# Patient Record
Sex: Male | Born: 1970 | Race: Black or African American | Hispanic: No | Marital: Single
Health system: Southern US, Community
[De-identification: ages and names within clinical notes are randomized; demographics above are authoritative.]

---

## 2014-04-23 ENCOUNTER — Encounter (HOSPITAL_COMMUNITY): Payer: Self-pay | Admitting: Emergency Medicine

## 2014-04-23 ENCOUNTER — Emergency Department (HOSPITAL_COMMUNITY): Payer: BC Managed Care – PPO

## 2014-04-23 ENCOUNTER — Emergency Department (HOSPITAL_COMMUNITY)
Admission: EM | Admit: 2014-04-23 | Discharge: 2014-04-23 | Disposition: A | Discharge status: Home / Self Care | Payer: BC Managed Care – PPO | Attending: Emergency Medicine | Admitting: Emergency Medicine

## 2014-04-23 DIAGNOSIS — N201 Calculus of ureter: Secondary | ICD-10-CM

## 2014-04-23 DIAGNOSIS — R319 Hematuria, unspecified: Secondary | ICD-10-CM

## 2014-04-23 LAB — URINE MICROSCOPIC-ADD ON

## 2014-04-23 LAB — URINALYSIS, ROUTINE W REFLEX MICROSCOPIC
Bilirubin Urine: NEGATIVE
GLUCOSE, UA: NEGATIVE mg/dL
Ketones, ur: NEGATIVE mg/dL
Leukocytes, UA: NEGATIVE
Nitrite: NEGATIVE
PROTEIN: NEGATIVE mg/dL
Specific Gravity, Urine: 1.012 (ref 1.005–1.030)
Urobilinogen, UA: 0.2 mg/dL (ref 0.0–1.0)
pH: 7 (ref 5.0–8.0)

## 2014-04-23 LAB — COMPREHENSIVE METABOLIC PANEL
ALT: 22 U/L (ref 0–53)
AST: 21 U/L (ref 0–37)
Albumin: 4 g/dL (ref 3.5–5.2)
Alkaline Phosphatase: 61 U/L (ref 39–117)
BILIRUBIN TOTAL: 0.5 mg/dL (ref 0.3–1.2)
BUN: 8 mg/dL (ref 6–23)
CHLORIDE: 101 meq/L (ref 96–112)
CO2: 24 mEq/L (ref 19–32)
Calcium: 9 mg/dL (ref 8.4–10.5)
Creatinine, Ser: 1.09 mg/dL (ref 0.50–1.35)
GFR calc Af Amer: >90 mL/min (ref >90)
GFR calc non Af Amer: 81 mL/min — ABNORMAL LOW (ref >90)
Glucose, Bld: 122 mg/dL — ABNORMAL HIGH (ref 70–99)
Potassium: 3.6 mEq/L — ABNORMAL LOW (ref 3.7–5.3)
SODIUM: 140 meq/L (ref 137–147)
Total Protein: 7.2 g/dL (ref 6.0–8.3)

## 2014-04-23 LAB — CBC WITH DIFFERENTIAL/PLATELET
Basophils Absolute: 0 10*3/uL (ref 0.0–0.1)
Basophils Relative: 0 % (ref 0–1)
Eosinophils Absolute: 0 10*3/uL (ref 0.0–0.7)
Eosinophils Relative: 0 % (ref 0–5)
HCT: 39.9 % (ref 39.0–52.0)
Hemoglobin: 13.4 g/dL (ref 13.0–17.0)
Lymphocytes Relative: 14 % (ref 12–46)
Lymphs Abs: 1 10*3/uL (ref 0.7–4.0)
MCH: 29.1 pg (ref 26.0–34.0)
MCHC: 33.6 g/dL (ref 30.0–36.0)
MCV: 86.6 fL (ref 78.0–100.0)
Monocytes Absolute: 0.3 10*3/uL (ref 0.1–1.0)
Monocytes Relative: 3 % (ref 3–12)
Neutro Abs: 6.2 10*3/uL (ref 1.7–7.7)
Neutrophils Relative %: 83 % — ABNORMAL HIGH (ref 43–77)
Platelets: 205 10*3/uL (ref 150–400)
RBC: 4.61 MIL/uL (ref 4.22–5.81)
RDW: 12.4 % (ref 11.5–15.5)
WBC: 7.5 10*3/uL (ref 4.0–10.5)

## 2014-04-23 MED ORDER — HYDROMORPHONE HCL PF 1 MG/ML IJ SOLN
1.0000 mg | Freq: Once | INTRAMUSCULAR | Status: AC
  Administered 2014-04-23: 1 mg via INTRAVENOUS
  Filled 2014-04-23: qty 1

## 2014-04-23 MED ORDER — OXYCODONE-ACETAMINOPHEN 5-325 MG PO TABS: 1.0000 | ORAL_TABLET | ORAL | Status: AC | PRN

## 2014-04-23 MED ORDER — ONDANSETRON HCL 4 MG/2ML IJ SOLN
4.0000 mg | Freq: Once | INTRAMUSCULAR | Status: AC
  Administered 2014-04-23: 4 mg via INTRAVENOUS
  Filled 2014-04-23: qty 2

## 2014-04-23 MED ORDER — IOHEXOL 300 MG/ML  SOLN: 50.0000 mL | Freq: Once | INTRAMUSCULAR | Status: DC | PRN

## 2014-04-23 MED ORDER — SODIUM CHLORIDE 0.9 % IV BOLUS (SEPSIS)
1000.0000 mL | Freq: Once | INTRAVENOUS | Status: AC
  Administered 2014-04-23: 1000 mL via INTRAVENOUS

## 2014-04-23 MED ORDER — MORPHINE SULFATE 4 MG/ML IJ SOLN
4.0000 mg | Freq: Once | INTRAMUSCULAR | Status: AC
  Administered 2014-04-23: 4 mg via INTRAVENOUS
  Filled 2014-04-23: qty 1

## 2014-04-23 MED ORDER — METOCLOPRAMIDE HCL 5 MG/ML IJ SOLN
10.0000 mg | Freq: Once | INTRAMUSCULAR | Status: AC
  Administered 2014-04-23: 10 mg via INTRAVENOUS
  Filled 2014-04-23: qty 2

## 2014-04-23 MED ORDER — TAMSULOSIN HCL 0.4 MG PO CAPS: 0.4000 mg | ORAL_CAPSULE | Freq: Every day | ORAL | Status: AC

## 2014-04-23 MED ORDER — IOHEXOL 300 MG/ML  SOLN
80.0000 mL | Freq: Once | INTRAMUSCULAR | Status: AC | PRN
  Administered 2014-04-23: 80 mL via INTRAVENOUS

## 2014-04-23 NOTE — Progress Notes (Signed)
  CARE MANAGEMENT ED NOTE 04/23/2014  Patient:  ANDREWJOSEPH, FORRISTER   Account Number:  [id number]  Date Initiated:  04/23/2014  Documentation initiated by:  Radford Pax  Subjective/Objective Assessment:   patient presents to Ed with right lower quadrant pain with nausea and vomiting.     Subjective/Objective Assessment Detail:     Action/Plan:   Action/Plan Detail:   Anticipated DC Date:       Status Recommendation to Physician:   Result of Recommendation:    Other ED Services  Consult Working Plan    DC Planning Services  Other  PCP issues    Choice offered to / List presented to:            Status of service:  Completed, signed off  ED Comments:   ED Comments Detail:  Cristy Friedlander  Signed  Progress Notes Service date: 04/23/2014 4:30 PM [facility] provided pt with a list of primary care resources to help patient establish primary care.

## 2014-04-23 NOTE — ED Provider Notes (Signed)
CSN: [id number]     Arrival date & time 04/23/14  1521 History   First MD Initiated Contact with Patient 04/23/14 1527     Chief Complaint  Patient presents with  . Abdominal Pain  . Emesis     (Consider location/radiation/quality/duration/timing/severity/associated sxs/prior Treatment) Patient is a 43 y.o. male presenting with abdominal pain and vomiting. The history is provided by the patient and medical records.  Abdominal Pain Associated symptoms: nausea and vomiting   Emesis Associated symptoms: abdominal pain    This is a 43 y.o. M with no significant PMH presenting to the ED for abdominal pain.  Pt states pain started earlier this morning and has progressively worsened throughout the day.  Pain localized to RLQ, described as sharp and stabbing and making it difficulty to find a comfortable position.  Endorses associated nausea and non-bloody, non-bilious emesis x4 today.  Has been unable to tolerate anything PO today.  Last BM this morning which was normal.  No urinary sx, flank pain, fever, or chills.  No prior hx of kidney stones.  No prior abdominal surgeries.    History reviewed. No pertinent past medical history. History reviewed. No pertinent past surgical history. No family history on file. History  Substance Use Topics  . Smoking status: Not on file  . Smokeless tobacco: Not on file  . Alcohol Use: Not on file    Review of Systems  Gastrointestinal: Positive for nausea, vomiting and abdominal pain.  All other systems reviewed and are negative.     Allergies  Review of patient's allergies indicates not on file.  Home Medications   Prior to Admission medications   Medication Sig Start Date End Date Taking? Authorizing Provider  Aspirin-Acetaminophen (GOODY BODY PAIN) 500-325 MG PACK Take 1 packet by mouth as needed (pain).   Yes Historical Provider, MD   BP 111/52  Pulse 85  Temp(Src) 98.8 F (37.1 C) (Oral)  Resp 16  SpO2 98%  Physical Exam  Nursing  note and vitals reviewed. Constitutional: He is oriented to person, place, and time. He appears well-developed and well-nourished.  Appears uncomfortable, writhing in bed  HENT:  Head: Normocephalic and atraumatic.  Mouth/Throat: Oropharynx is clear and moist.  Eyes: Conjunctivae and EOM are normal. Pupils are equal, round, and reactive to light.  Neck: Normal range of motion.  Cardiovascular: Normal rate, regular rhythm and normal heart sounds.   Pulmonary/Chest: Effort normal and breath sounds normal. No respiratory distress. He has no wheezes.  Abdominal: Soft. Bowel sounds are normal. There is tenderness in the right lower quadrant. There is tenderness at McBurney's point. There is no CVA tenderness and negative Murphy's sign.  Abdomen soft, non-distended, TTP in RLQ with + McBurney's point tenderness No CVA tenderness  Musculoskeletal: Normal range of motion.  Neurological: He is alert and oriented to person, place, and time.  Skin: Skin is warm and dry.  Psychiatric: He has a normal mood and affect.    ED Course  Procedures (including critical care time) Labs Review Labs Reviewed  CBC WITH DIFFERENTIAL - Abnormal; Notable for the following:    Neutrophils Relative % 83 (*)    All other components within normal limits  COMPREHENSIVE METABOLIC PANEL - Abnormal; Notable for the following:    Potassium 3.6 (*)    Glucose, Bld 122 (*)    GFR calc non Af Amer 81 (*)    All other components within normal limits  URINALYSIS, ROUTINE W REFLEX MICROSCOPIC - Abnormal; Notable for the  following:    Hgb urine dipstick LARGE (*)    All other components within normal limits  URINE MICROSCOPIC-ADD ON    Imaging Review Ct Abdomen Pelvis W Contrast  04/23/2014   CLINICAL DATA:  Right lower quadrant pain.  Nausea and vomiting.  EXAM: CT ABDOMEN AND PELVIS WITH CONTRAST  TECHNIQUE: Multidetector CT imaging of the abdomen and pelvis was performed using the standard protocol following bolus  administration of intravenous contrast.  CONTRAST:  27mL OMNIPAQUE IOHEXOL 300 MG/ML  SOLN  COMPARISON:  None.  FINDINGS: A 1-2 mm stone lies at the right ureteral vesicular junction causing mild right hydroureteronephrosis. There is mild stranding along the distal right ureter.  Normal appendix.  Clear lung bases. The heart is normal size. Sub cm low-density lesions in the right lobe of the liver, likely cysts. The liver is otherwise unremarkable.  Normal spleen, gallbladder and pancreas. No bile duct dilation. No adrenal masses. No renal masses. No left hydronephrosis. Left ureter is unremarkable.  No adenopathy. No abnormal fluid collections. The bowel is unremarkable.  No significant bony abnormality.  IMPRESSION: 1. 1-2 mm stone at the right ureterovesicular junction causes mild right hydroureteronephrosis. This is presumed to be the cause of this patient's right lower quadrant pain. No other evidence of an acute abnormality. 2. Normal appendix.   Electronically Signed   By: Amie Portland M.D.   On: 04/23/2014 18:19     EKG Interpretation None      MDM   Final diagnoses:  Right ureteral calculus  Hematuria   43-year-old male presenting to the ED for right lower quadrant abdominal pain, onset early this morning. He endorses associated nausea and vomiting. Patient denies urinary symptoms or flank pain. No parenchymal surgeries. On exam, patient is afebrile but he does appear very uncomfortable as writhing around in bed. We'll plan for labs and CT abdomen pelvis with contrast for further evaluation.  Labs reassuring. UA with large blood. CT revealing 2 mm stone at right UVJ with associated hydroureteronephrosis, appendix normal.  Patient's pain is well controlled after several doses of pain medication. He'll be discharged with urine strainer, Percocet, Flomax. He will followup with urology.  Discussed plan with patient, he/she acknowledged understanding and agreed with plan of care.  Return  precautions given for new or worsening symptoms.    Garlon Hatchet, PA-C 04/23/14 2100

## 2014-04-23 NOTE — ED Notes (Signed)
PA Sanders at bedside  

## 2014-04-23 NOTE — ED Notes (Signed)
Bed: WA07 Expected date:  Expected time:  Means of arrival:  Comments: EMS 

## 2014-04-23 NOTE — ED Notes (Signed)
Pt BIB EMS. Pt c/o RLQ pain since this morning with nausea and vomiting. Pt denies diarrhea. Pt was given Zofran 4mg  IVP by EMS PTA. Pt alert, no acute distress. Skin warm and dry.

## 2014-04-23 NOTE — ED Notes (Signed)
Patient transported to CT 

## 2014-04-23 NOTE — Discharge Instructions (Signed)
Take the prescribed medication as directed. Strain your urine to monitor for passage of stones. Follow-up with [facility]. Return to the ED for new or worsening symptoms.

## 2014-04-23 NOTE — Progress Notes (Signed)
[facility] provided pt with a list of primary care resources to help patient establish primary care.  °

## 2014-04-24 NOTE — ED Provider Notes (Signed)
Medical screening examination/treatment/procedure(s) were performed by non-physician practitioner and as supervising physician I was immediately available for consultation/collaboration.   EKG Interpretation None        Yukiko Minnich M Tait Balistreri, MD 04/24/14 0016 

## 2015-03-03 IMAGING — CT CT ABD-PELV W/ CM
1 of 3 series · 14 of 32 positions shown, 19 images · IV contrast (OMNIPAQUE 300)
Comparison: None.

CLINICAL DATA: Right lower quadrant pain.  Nausea and vomiting.

EXAM:
CT ABDOMEN AND PELVIS WITH CONTRAST
TECHNIQUE: Multidetector CT imaging of the abdomen and pelvis was performed
using the standard protocol following bolus administration of
intravenous contrast.
CONTRAST:  80mL OMNIPAQUE IOHEXOL 300 MG/ML  SOLN

[Series 2: abd/pel with · axial · 0.74mm/px · z∈[-472,-47]mm · 14 of 96 slices shown, 19 images]
[im 6/96  soft-tissue]
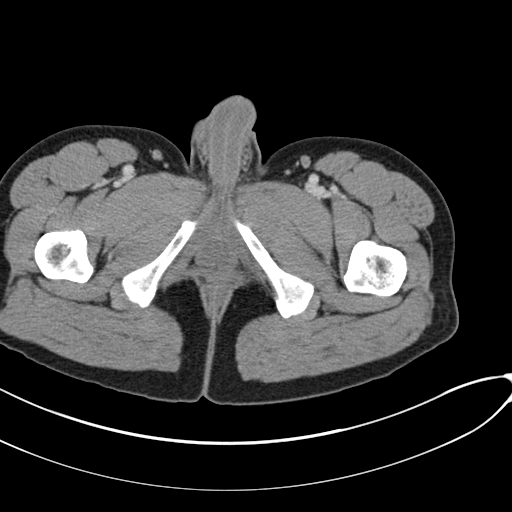
[im 6/96  bone]
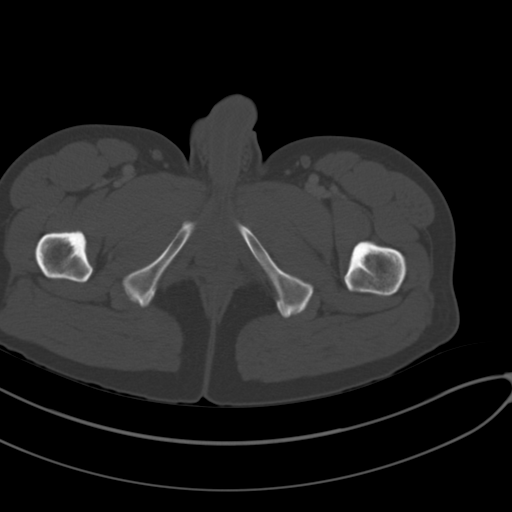
[im 16/96  soft-tissue]
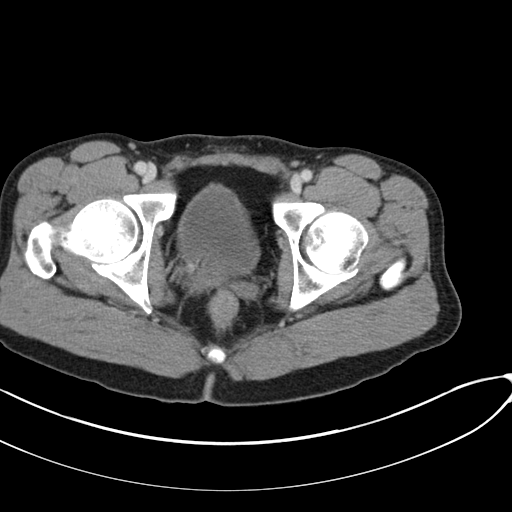
[im 21/96  soft-tissue]
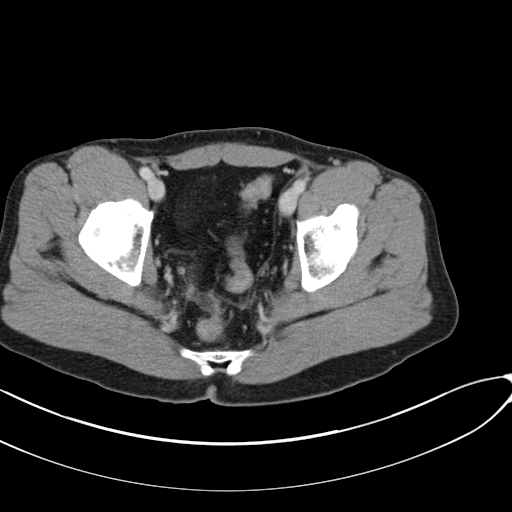
[im 26/96  soft-tissue]
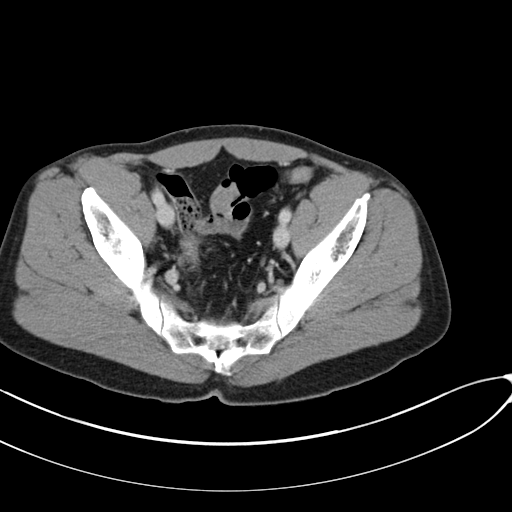
[im 36/96  soft-tissue]
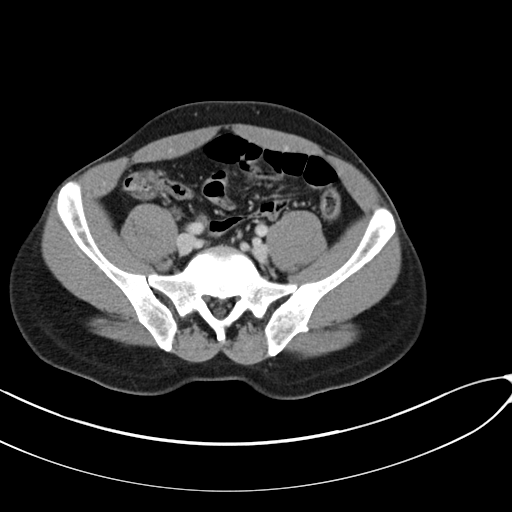
[im 41/96  soft-tissue]
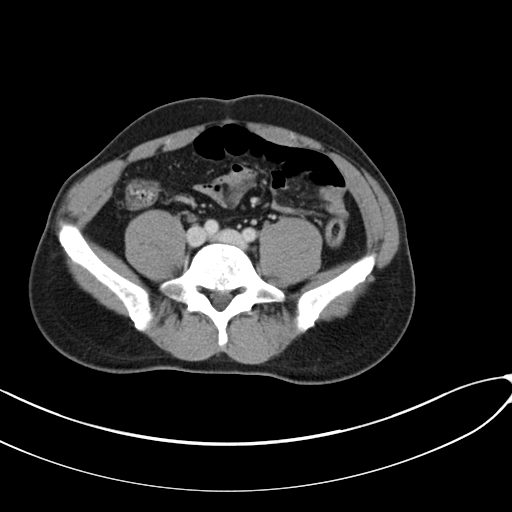
[im 51/96  soft-tissue]
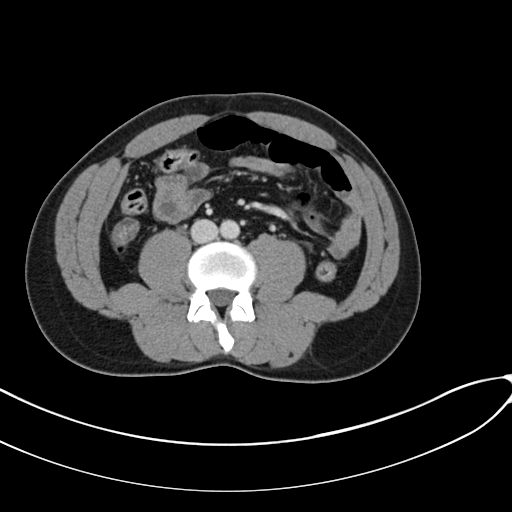
[im 56/96  soft-tissue]
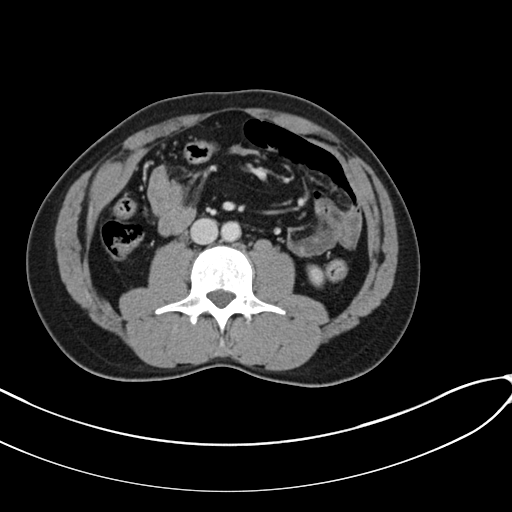
[im 61/96  soft-tissue]
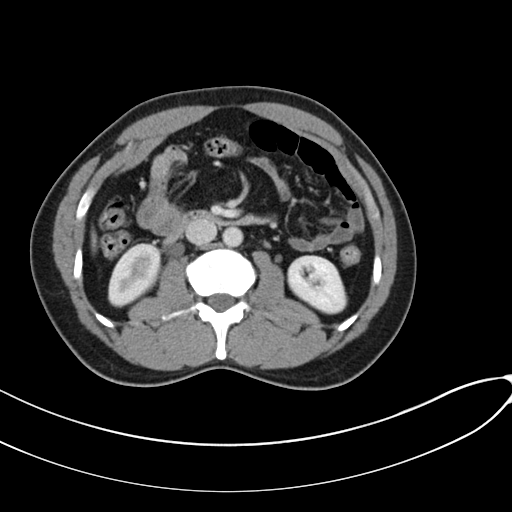
[im 61/96  bone]
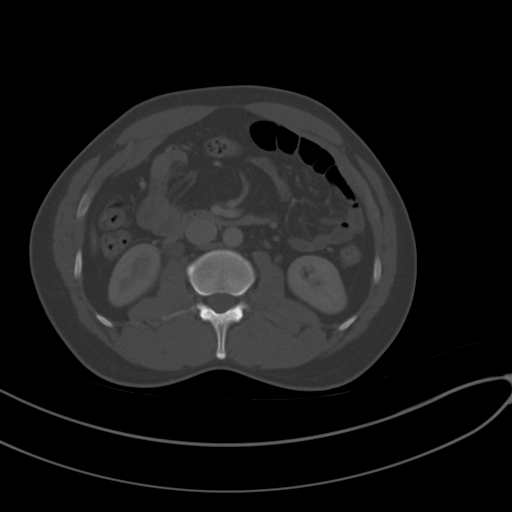
[im 71/96  soft-tissue]
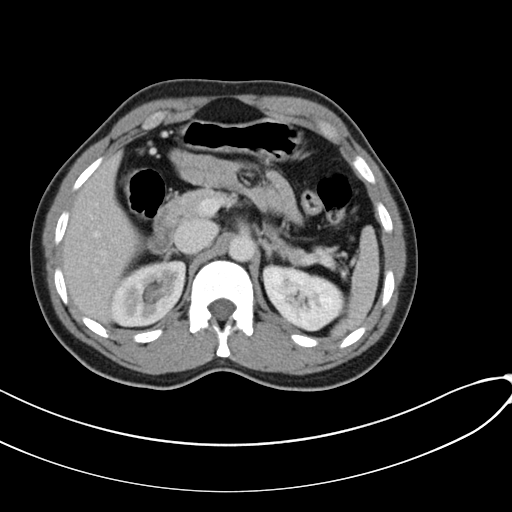
[im 76/96  soft-tissue]
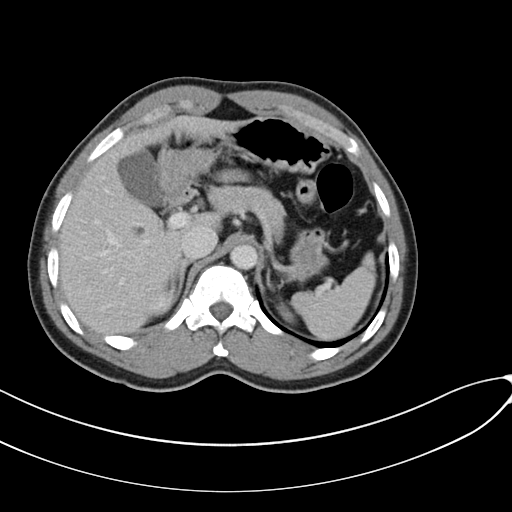
[im 76/96  lung]
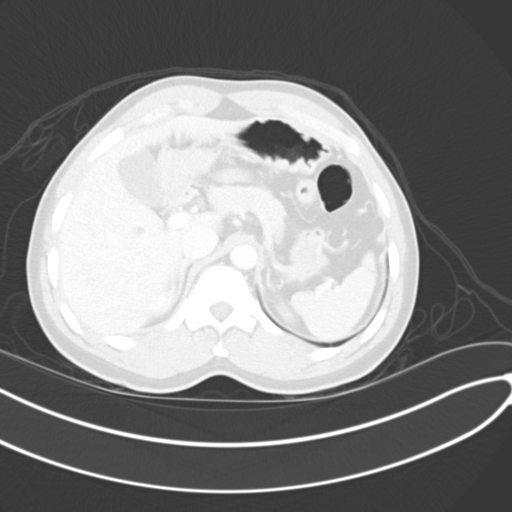
[im 81/96  soft-tissue]
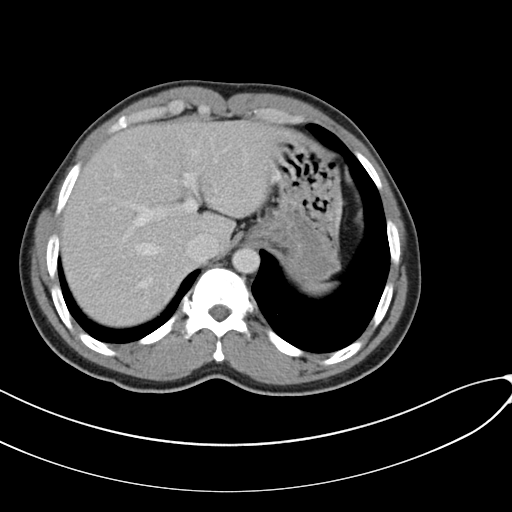
[im 81/96  lung]
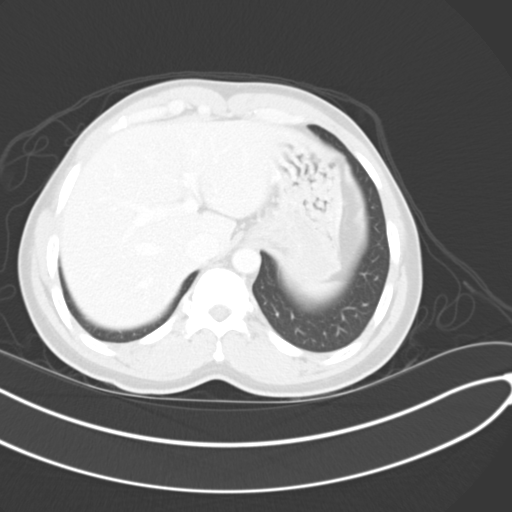
[im 86/96  lung]
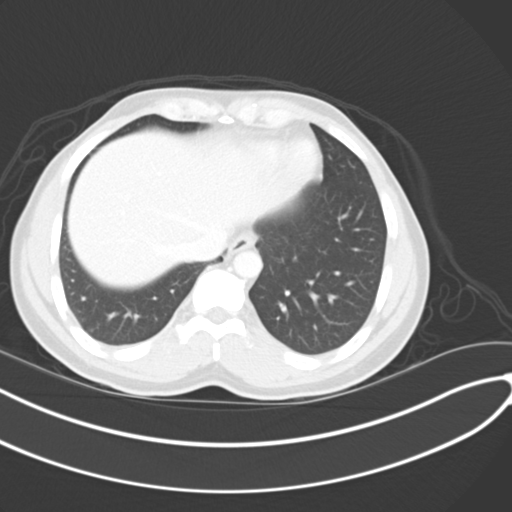
[im 91/96  soft-tissue]
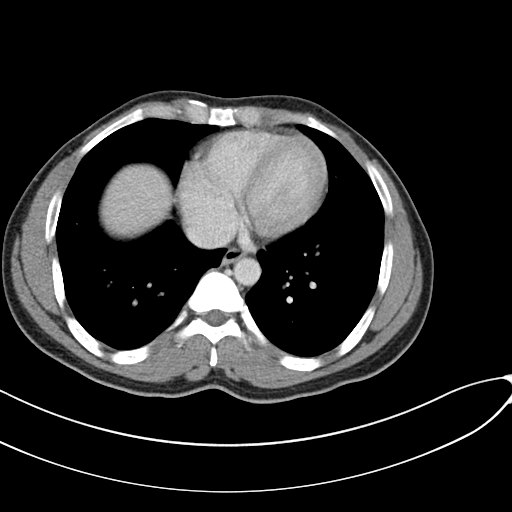
[im 91/96  lung]
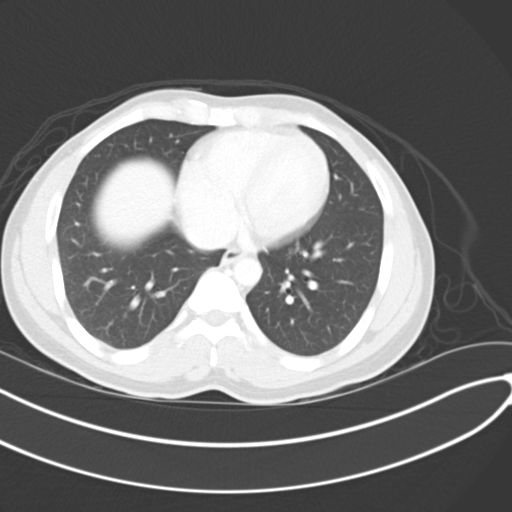

[14 of 32 positions shown; findings below may reference images not displayed]

FINDINGS: A 1-2 mm stone lies at the right ureteral vesicular junction causing
mild right hydroureteronephrosis. There is mild stranding along the
distal right ureter.

Normal appendix.

Clear lung bases. The heart is normal size. Sub cm low-density
lesions in the right lobe of the liver, likely cysts. The liver is
otherwise unremarkable.

Normal spleen, gallbladder and pancreas. No bile duct dilation. No
adrenal masses. No renal masses. No left hydronephrosis. Left ureter
is unremarkable.

No adenopathy. No abnormal fluid collections. The bowel is
unremarkable.

No significant bony abnormality.
IMPRESSION: 1. 1-2 mm stone at the right ureterovesicular junction causes mild
right hydroureteronephrosis. This is presumed to be the cause of
this patient's right lower quadrant pain. No other evidence of an
acute abnormality.
2. Normal appendix.
